# Patient Record
Sex: Female | Born: 1955 | Race: White | Hispanic: No | Marital: Married | Smoking: Never smoker
Health system: Southern US, Community
[De-identification: ages and names within clinical notes are randomized; demographics above are authoritative.]

## PROBLEM LIST (undated history)

## (undated) DIAGNOSIS — K5900 Constipation, unspecified: Secondary | ICD-10-CM

## (undated) DIAGNOSIS — K219 Gastro-esophageal reflux disease without esophagitis: Secondary | ICD-10-CM

## (undated) DIAGNOSIS — G43909 Migraine, unspecified, not intractable, without status migrainosus: Secondary | ICD-10-CM

## (undated) DIAGNOSIS — F419 Anxiety disorder, unspecified: Secondary | ICD-10-CM

## (undated) DIAGNOSIS — I10 Essential (primary) hypertension: Secondary | ICD-10-CM

## (undated) DIAGNOSIS — E785 Hyperlipidemia, unspecified: Secondary | ICD-10-CM

## (undated) DIAGNOSIS — C801 Malignant (primary) neoplasm, unspecified: Secondary | ICD-10-CM

## (undated) DIAGNOSIS — Z9109 Other allergy status, other than to drugs and biological substances: Secondary | ICD-10-CM

## (undated) HISTORY — DX: Migraine, unspecified, not intractable, without status migrainosus: G43.909

## (undated) HISTORY — DX: Other allergy status, other than to drugs and biological substances: Z91.09

## (undated) HISTORY — DX: Anxiety disorder, unspecified: F41.9

## (undated) HISTORY — DX: Constipation, unspecified: K59.00

## (undated) HISTORY — PX: TUBAL LIGATION: SHX77

## (undated) HISTORY — DX: Essential (primary) hypertension: I10

## (undated) HISTORY — PX: DILATION AND CURETTAGE OF UTERUS: SHX78

## (undated) HISTORY — DX: Hyperlipidemia, unspecified: E78.5

## (undated) HISTORY — DX: Gastro-esophageal reflux disease without esophagitis: K21.9

---

## 1977-01-16 HISTORY — PX: TONSILLECTOMY: SUR1361

## 1995-01-17 HISTORY — PX: CHOLECYSTECTOMY: SHX55

## 1997-01-16 HISTORY — PX: VAGINAL HYSTERECTOMY: SUR661

## 2004-10-11 ENCOUNTER — Ambulatory Visit: Payer: Self-pay | Admitting: Internal Medicine

## 2005-10-13 ENCOUNTER — Ambulatory Visit: Payer: Self-pay | Admitting: Internal Medicine

## 2006-10-17 ENCOUNTER — Ambulatory Visit: Payer: Self-pay | Admitting: Internal Medicine

## 2007-12-19 ENCOUNTER — Ambulatory Visit: Payer: Self-pay | Admitting: Internal Medicine

## 2009-01-05 ENCOUNTER — Ambulatory Visit: Payer: Self-pay | Admitting: Internal Medicine

## 2009-04-30 ENCOUNTER — Ambulatory Visit: Payer: Self-pay | Admitting: Unknown Physician Specialty

## 2010-01-11 ENCOUNTER — Ambulatory Visit: Payer: Self-pay | Admitting: Family Medicine

## 2011-01-17 HISTORY — PX: SKIN CANCER EXCISION: SHX779

## 2011-03-03 ENCOUNTER — Ambulatory Visit: Payer: Self-pay | Admitting: Internal Medicine

## 2012-03-04 ENCOUNTER — Ambulatory Visit: Payer: Self-pay | Admitting: Internal Medicine

## 2013-03-18 ENCOUNTER — Ambulatory Visit: Payer: Self-pay | Admitting: Family Medicine

## 2014-03-20 ENCOUNTER — Ambulatory Visit: Payer: Self-pay | Admitting: Family Medicine

## 2014-03-26 ENCOUNTER — Ambulatory Visit: Payer: Self-pay | Admitting: Family Medicine

## 2014-04-27 ENCOUNTER — Encounter: Payer: Self-pay | Admitting: *Deleted

## 2014-05-11 ENCOUNTER — Emergency Department
Admit: 2014-05-11 | Disposition: A | Discharge status: Home / Self Care | Payer: Self-pay | Admitting: Emergency Medicine

## 2014-05-11 LAB — CBC WITH DIFFERENTIAL/PLATELET
BASOS PCT: 0.3 %
Basophil #: 0.1 10*3/uL (ref 0.0–0.1)
EOS PCT: 0.3 %
Eosinophil #: 0.1 10*3/uL (ref 0.0–0.7)
HCT: 39.3 % (ref 35.0–47.0)
HGB: 12.9 g/dL (ref 12.0–16.0)
LYMPHS PCT: 2.9 %
Lymphocyte #: 0.5 10*3/uL — ABNORMAL LOW (ref 1.0–3.6)
MCH: 29 pg (ref 26.0–34.0)
MCHC: 32.8 g/dL (ref 32.0–36.0)
MCV: 88 fL (ref 80–100)
Monocyte #: 0.7 x10 3/mm (ref 0.2–0.9)
Monocyte %: 4.1 %
NEUTROS ABS: 16.6 10*3/uL — AB (ref 1.4–6.5)
Neutrophil %: 92.4 %
Platelet: 201 10*3/uL (ref 150–440)
RBC: 4.44 10*6/uL (ref 3.80–5.20)
RDW: 14 % (ref 11.5–14.5)
WBC: 18 10*3/uL — AB (ref 3.6–11.0)

## 2014-05-11 LAB — BASIC METABOLIC PANEL
ANION GAP: 7 (ref 7–16)
BUN: 15 mg/dL
CHLORIDE: 109 mmol/L
CO2: 26 mmol/L
Calcium, Total: 9.3 mg/dL
Creatinine: 0.89 mg/dL
Glucose: 128 mg/dL — ABNORMAL HIGH
POTASSIUM: 3.2 mmol/L — AB
SODIUM: 142 mmol/L

## 2014-05-11 LAB — URINALYSIS, COMPLETE
Bacteria: NONE SEEN
Bilirubin,UR: NEGATIVE
Glucose,UR: NEGATIVE mg/dL (ref 0–75)
KETONE: NEGATIVE
LEUKOCYTE ESTERASE: NEGATIVE
Nitrite: NEGATIVE
PH: 6 (ref 4.5–8.0)
PROTEIN: NEGATIVE
Specific Gravity: 1.01 (ref 1.003–1.030)
Squamous Epithelial: NONE SEEN

## 2014-05-11 LAB — TROPONIN I
Troponin-I: <0.03 ng/mL
Troponin-I: <0.03 ng/mL

## 2014-10-07 ENCOUNTER — Other Ambulatory Visit: Payer: Self-pay | Admitting: Family Medicine

## 2014-10-07 DIAGNOSIS — R921 Mammographic calcification found on diagnostic imaging of breast: Secondary | ICD-10-CM

## 2014-10-16 ENCOUNTER — Ambulatory Visit
Admission: RE | Admit: 2014-10-16 | Discharge: 2014-10-16 | Disposition: A | Discharge status: Home / Self Care | Payer: Commercial Indemnity | Source: Ambulatory Visit | Attending: Family Medicine | Admitting: Family Medicine

## 2014-10-16 DIAGNOSIS — R921 Mammographic calcification found on diagnostic imaging of breast: Secondary | ICD-10-CM | POA: Diagnosis not present

## 2014-10-16 HISTORY — DX: Malignant (primary) neoplasm, unspecified: C80.1

## 2014-10-19 ENCOUNTER — Other Ambulatory Visit: Payer: Self-pay | Admitting: Family Medicine

## 2014-10-19 DIAGNOSIS — R922 Inconclusive mammogram: Secondary | ICD-10-CM

## 2015-03-17 ENCOUNTER — Other Ambulatory Visit: Payer: Self-pay | Admitting: Pediatrics

## 2015-04-16 ENCOUNTER — Ambulatory Visit: Payer: Commercial Indemnity

## 2015-04-16 ENCOUNTER — Other Ambulatory Visit: Payer: Commercial Indemnity

## 2015-04-16 ENCOUNTER — Ambulatory Visit
Admission: RE | Admit: 2015-04-16 | Discharge: 2015-04-16 | Disposition: A | Discharge status: Home / Self Care | Payer: Commercial Indemnity | Source: Ambulatory Visit | Attending: Family Medicine | Admitting: Family Medicine

## 2015-04-16 DIAGNOSIS — R922 Inconclusive mammogram: Secondary | ICD-10-CM | POA: Diagnosis present

## 2016-01-16 IMAGING — MG MM ADDITIONAL VIEWS AT NO CHARGE
2 series · 2 of 2 positions shown · non-contrast
Comparison: 03/20/2014, 03/18/2013, additional prior studies dating
back to 10/11/2004

CLINICAL DATA: 58-year-old female, callback from screening
mammogram for right breast calcifications

EXAM:
DIGITAL DIAGNOSTIC RIGHT MAMMOGRAM

[R ML]
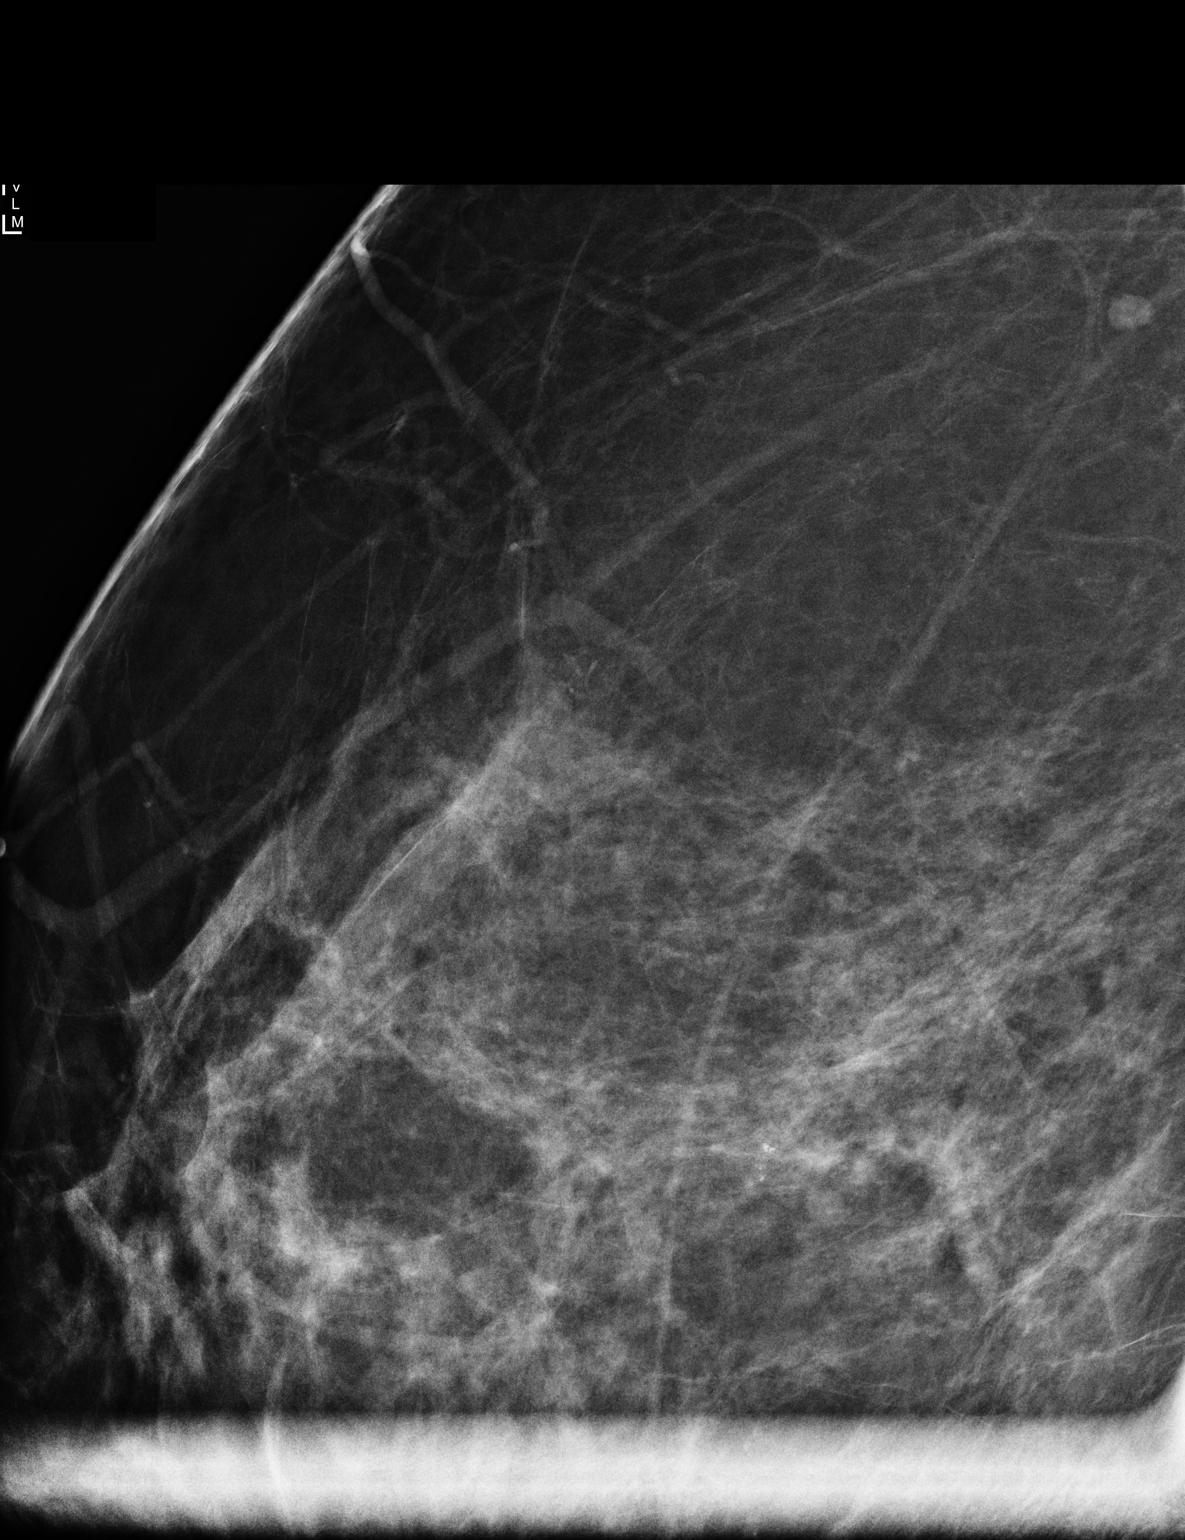

[R CC]
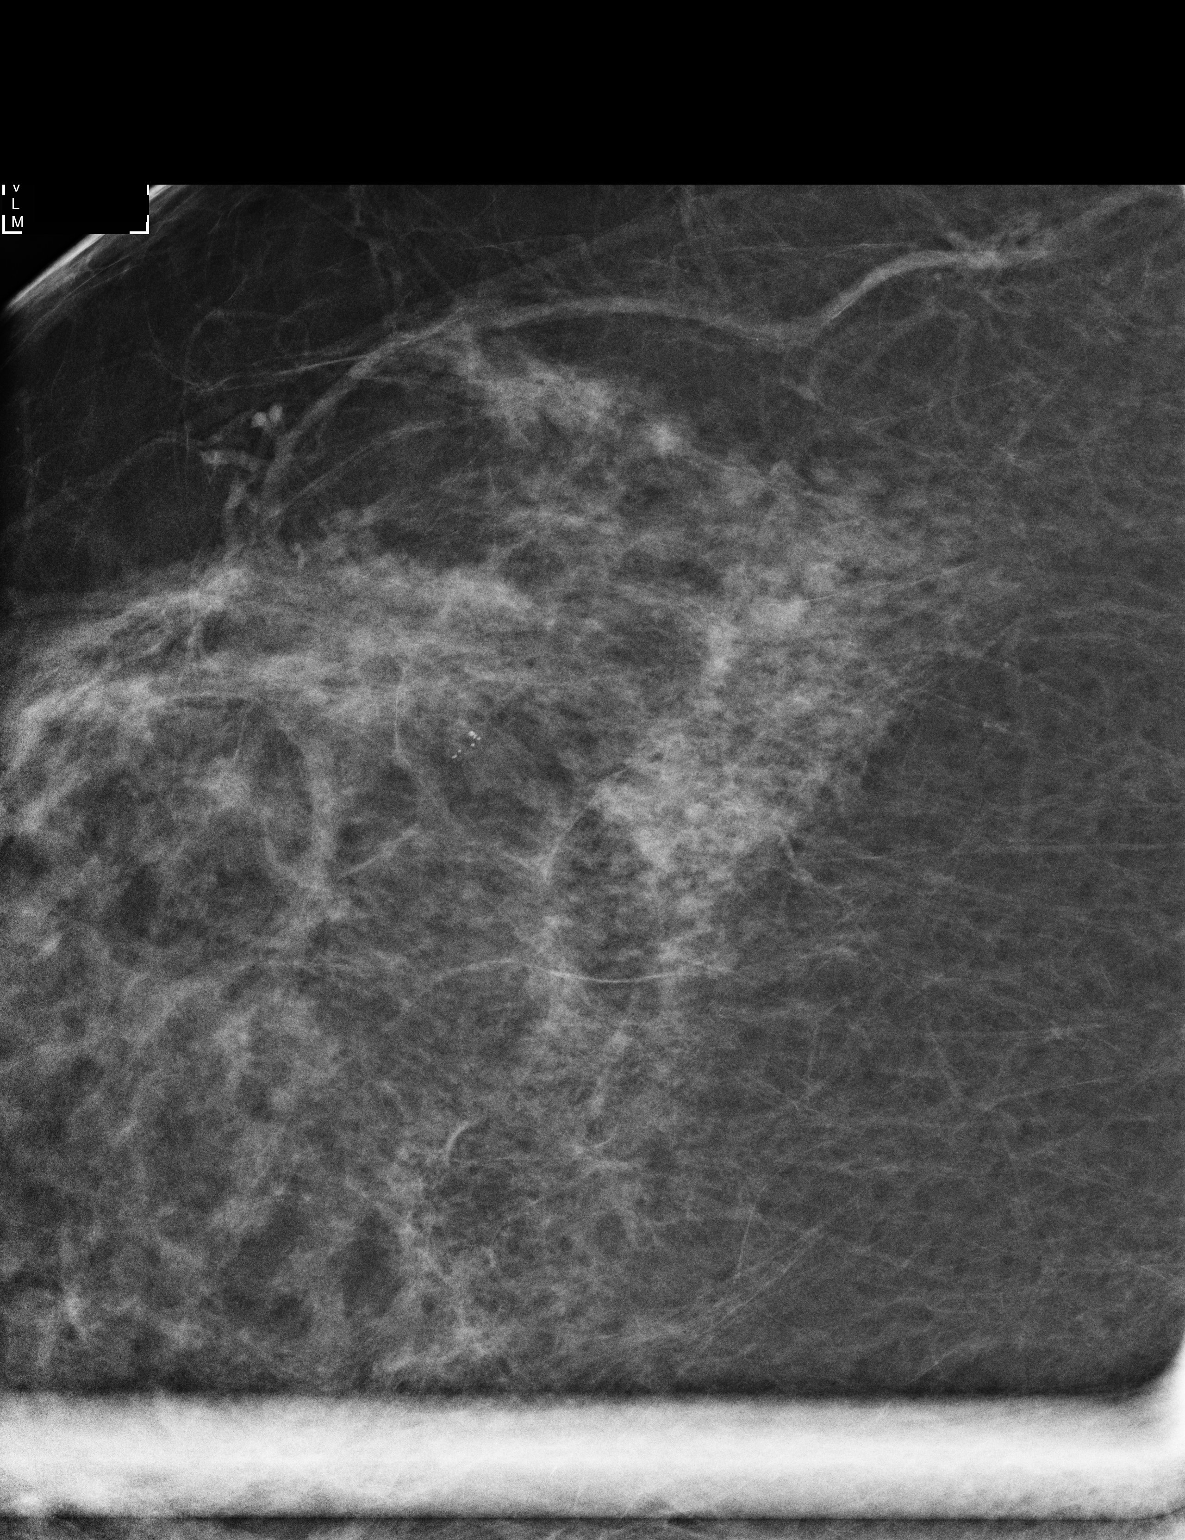

[2 of 2 positions shown; findings below may reference images not displayed]

ACR Breast Density Category b: There are scattered areas of
fibroglandular density.
FINDINGS: On additional views, there is an approximately 4 mm group of
predominantly round calcifications within the upper, outer right
breast, middle depth. These calcifications are felt likely benign.
IMPRESSION: Probably benign right breast calcifications.

RECOMMENDATION:
Right diagnostic mammogram in 6 months. Options including short-term
follow-up versus stereotactic guided right breast biopsy were
discussed with the patient and her husband. The patient wishes to
pursue follow-up at this time.

I have discussed the findings and recommendations with the patient.
Results were also provided in writing at the conclusion of the
visit. If applicable, a reminder letter will be sent to the patient
regarding the next appointment.

BI-RADS CATEGORY  3: Probably benign.

## 2016-03-30 ENCOUNTER — Other Ambulatory Visit: Payer: Self-pay | Admitting: Family Medicine

## 2016-03-30 DIAGNOSIS — R921 Mammographic calcification found on diagnostic imaging of breast: Secondary | ICD-10-CM

## 2016-05-05 ENCOUNTER — Ambulatory Visit
Admission: RE | Admit: 2016-05-05 | Discharge: 2016-05-05 | Disposition: A | Discharge status: Home / Self Care | Payer: Commercial Indemnity | Source: Ambulatory Visit | Attending: Family Medicine | Admitting: Family Medicine

## 2016-05-05 DIAGNOSIS — R921 Mammographic calcification found on diagnostic imaging of breast: Secondary | ICD-10-CM

## 2016-06-07 ENCOUNTER — Encounter: Payer: Self-pay | Admitting: Obstetrics and Gynecology

## 2016-06-07 ENCOUNTER — Ambulatory Visit (INDEPENDENT_AMBULATORY_CARE_PROVIDER_SITE_OTHER): Payer: Managed Care, Other (non HMO) | Admitting: Obstetrics and Gynecology

## 2016-06-07 VITALS — BP 149/91 | HR 88 | Ht 64.0 in | Wt 195.0 lb

## 2016-06-07 DIAGNOSIS — N941 Unspecified dyspareunia: Secondary | ICD-10-CM | POA: Insufficient documentation

## 2016-06-07 DIAGNOSIS — Z90710 Acquired absence of both cervix and uterus: Secondary | ICD-10-CM | POA: Diagnosis not present

## 2016-06-07 DIAGNOSIS — R6882 Decreased libido: Secondary | ICD-10-CM

## 2016-06-07 DIAGNOSIS — E669 Obesity, unspecified: Secondary | ICD-10-CM | POA: Insufficient documentation

## 2016-06-07 DIAGNOSIS — Z780 Asymptomatic menopausal state: Secondary | ICD-10-CM | POA: Diagnosis not present

## 2016-06-07 DIAGNOSIS — N951 Menopausal and female climacteric states: Secondary | ICD-10-CM | POA: Diagnosis not present

## 2016-06-07 DIAGNOSIS — N952 Postmenopausal atrophic vaginitis: Secondary | ICD-10-CM | POA: Diagnosis not present

## 2016-06-07 MED ORDER — ESTRADIOL 2 MG VA RING: 2.0000 mg | VAGINAL_RING | VAGINAL | 3 refills | Status: DC

## 2016-06-07 NOTE — Progress Notes (Signed)
GYN ENCOUNTER NOTE  Subjective:       Carrie Esparza is a 61 y.o. [phone] female is here for gynecologic evaluation of the following issues:  1. Hot flashes 2. Vaginal dryness 3. Dyspareunia 4. Obesity 5. Decreased libido  Status post TVH with anterior colporrhaphy and pubovaginal sling in 1999 Remote history of short-term estrogen replacement therapy in the early 2000's. Currently with major complaints including vasomotor symptoms, vaginal dryness, and associated dyspareunia which has negatively affected her sex drive and relations with her spouse. Bladder function is normal without any significant urinary incontinence. Bowel function is normal with frequency every other day so long as she uses Metamucil.   Gynecologic History No LMP recorded. Patient has had a hysterectomy. Contraception: status post hysterectomy Last Pap: No history of abnormal Paps Last mammogram: 05/05/2016 BI-RADS 2  Obstetric History OB History  Gravida Para Term Preterm AB Living  3 2 2   1 2   SAB TAB Ectopic Multiple Live Births  1       2    # Outcome Date GA Lbr Len/2nd Weight Sex Delivery Anes PTL Lv  3 Term 1992   8 lb 9.6 oz (3.901 kg) M Vag-Spont   LIV  2 SAB 1991          1 Term 1987   8 lb 12.8 oz (3.992 kg) M Vag-Spont   LIV      Past Medical History:  Diagnosis Date  . Acid reflux   . Anxiety   . Cancer ([place])    melanoma  . Constipation   . Environmental allergies   . Hyperlipemia   . Hypertension   . Migraine     Past Surgical History:  Procedure Laterality Date  . CHOLECYSTECTOMY  1997  . DILATION AND CURETTAGE OF UTERUS    . SKIN CANCER EXCISION  2013  . TONSILLECTOMY  1979  . TUBAL LIGATION    . TVH with anterior colporrhaphy and pubovaginal sling   1999   bvd    No current outpatient prescriptions on file prior to visit.   No current facility-administered medications on file prior to visit.     No Known Allergies  Social History   Social History  .  Marital status: Married    Spouse name: N/A  . Number of children: N/A  . Years of education: N/A   Occupational History  . Not on file.   Social History Main Topics  . Smoking status: Never Smoker  . Smokeless tobacco: Never Used  . Alcohol use Yes     Comment: occas  . Drug use: No  . Sexual activity: Yes    Birth control/ protection: Surgical   Other Topics Concern  . Not on file   Social History Narrative  . No narrative on file    Family History  Problem Relation Age of Onset  . Breast cancer Cousin 39  . Breast cancer Paternal Aunt 35  . Diabetes Mother   . Hyperlipidemia Mother   . Hypertension Mother   . Colon cancer Neg Hx   . Ovarian cancer Neg Hx     The following portions of the patient's history were reviewed and updated as appropriate: allergies, current medications, past family history, past medical history, past social history, past surgical history and problem list.  Review of Systems Review of Systems - comprehensive review of systems is negative except for that noted in the history of present illness  Objective:   BP (!) 149/91  Pulse 88   Ht 5\' 4"  (1.626 m)   Wt 195 lb (88.5 kg)   BMI 33.47 kg/m  CONSTITUTIONAL: Well-developed, well-nourished female in no acute distress.  HENT:  Normocephalic, atraumatic.  NECK: Normal range of motion, supple, no masses.  Normal thyroid.  SKIN: Skin is warm and dry. No rash noted. Not diaphoretic. No erythema. No pallor. [place]: Alert and oriented to person, place, and time. PSYCHIATRIC: Normal mood and affect. Normal behavior. Normal judgment and thought content. CARDIOVASCULAR:Not Examined RESPIRATORY: Not Examined BREASTS: Not Examined ABDOMEN: Soft, non distended; Non tender.  No Organomegaly. PELVIC:  External Genitalia: Normal  BUS: Normal  Vagina:Moderate atrophic changes; first-degree cystocele and: Mild rectocele  Cervix: Surgically absent  Uterus: Surgically absent  Adnexa: Normal;  nonpalpable and nontender  RV: Normal external exam; normal sphincter tone; no rectal masses  Bladder: Nontender MUSCULOSKELETAL: Normal range of motion. No tenderness.  No cyanosis, clubbing, or edema.     Assessment:   1. Menopause  2. Vaginal atrophy  3. Dyspareunia, female  4. Status post vaginal hysterectomy  5. Decreased libido  6. Obesity (BMI 30-39.9)  7. Vasomotor symptoms due to menopause     Plan:   1. Begin a trial of Estring 2 mg every 3 months for vaginal 2. Lubricants recommended 3. Left cell changes are discussed including healthy eating with scheduled exercise in order to achieve weight loss 4. Return in 3 months for follow-up 5. Discussed multifactorial etiology to decreased libido in women as compared to man  A total of 30 minutes were spent face-to-face with the patient during the encounter with greater than 50% dealing with counseling and coordination of care.  Brayton Mars, MD  Note: This dictation was prepared with Dragon dictation along with smaller phrase technology. Any transcriptional errors that result from this process are unintentional.

## 2016-06-07 NOTE — Patient Instructions (Signed)
1. Begin using the Estring 2 mg intravaginal every 3 months 2. Vaginal lubricants include:  Astroglide  JO H2O lubricant  Coconut oil  Virgin olive oil 3. Return in 3 months for follow-up

## 2016-09-06 ENCOUNTER — Ambulatory Visit (INDEPENDENT_AMBULATORY_CARE_PROVIDER_SITE_OTHER): Payer: Managed Care, Other (non HMO) | Admitting: Obstetrics and Gynecology

## 2016-09-06 ENCOUNTER — Encounter: Payer: Self-pay | Admitting: Obstetrics and Gynecology

## 2016-09-06 VITALS — BP 152/84 | HR 94 | Ht 64.0 in | Wt 195.4 lb

## 2016-09-06 DIAGNOSIS — R6882 Decreased libido: Secondary | ICD-10-CM

## 2016-09-06 DIAGNOSIS — Z90710 Acquired absence of both cervix and uterus: Secondary | ICD-10-CM

## 2016-09-06 DIAGNOSIS — N941 Unspecified dyspareunia: Secondary | ICD-10-CM

## 2016-09-06 DIAGNOSIS — N951 Menopausal and female climacteric states: Secondary | ICD-10-CM

## 2016-09-06 DIAGNOSIS — Z780 Asymptomatic menopausal state: Secondary | ICD-10-CM | POA: Diagnosis not present

## 2016-09-06 DIAGNOSIS — N952 Postmenopausal atrophic vaginitis: Secondary | ICD-10-CM

## 2016-09-06 NOTE — Patient Instructions (Signed)
1.  Continue using the Estring every 3 months. 2.  Return in 9 months for annual exam

## 2016-09-06 NOTE — Progress Notes (Signed)
Chief complaint: 1.  Menopausal symptoms. 2.  Vaginal atrophy. 3.  Dyspareunia. 4.  Decreased libido.  Carrie Esparza presents today for 3 month follow-up after starting the Estring therapy for estrogen replacement therapy. She has noted a marked reduction in vasomotor symptoms, although they do still occur on occasion. She has noted less vaginal dryness and has not had to use vaginal lubricants since using the Estring. She has had less discomfort with intercourse. She has noted an improvement in her libido. She is not experiencing any side effects from the Estring  Past medical history, past surgical history, problem list.  OBJECTIVE: BP (!) 152/84   Pulse 94   Ht 5\' 4"  (1.626 m)   Wt 195 lb 6.4 oz (88.6 kg)   BMI 33.54 kg/m  Pleasant, well-appearing female in no acute distress. ABDOMEN: Soft, non distended; Non tender.  No Organomegaly. PELVIC:             External Genitalia: Normal             BUS: Normal             Vagina:Minimal atrophic changes; first-degree cystocele and: Mild rectocele ; Estring is noted within the vagina             Cervix: Surgically absent             Uterus: Surgically absent             Adnexa: Normal; nonpalpable and nontender             RV: Normal external exam             Bladder: Nontender  ASSESSMENT: 1.  Vasomotor symptoms, improved. 2.  Vaginal atrophy, improved. 3.  Dyspareunia, improved. 4.  Decreased libido, improved.  PLAN: 1.  Continue using the Estring 2 mg intravaginally every   2.Return in 9 months for annual exam or as needed.  A total of 15 minutes were spent face-to-face with the patient during this encounter and over half of that time dealt with counseling and coordination of care.  Brayton Mars, MD  Note: This dictation was prepared with Dragon dictation along with smaller phrase technology. Any transcriptional errors that result from this process are unintentional.

## 2016-09-07 ENCOUNTER — Encounter: Payer: Managed Care, Other (non HMO) | Admitting: Obstetrics and Gynecology

## 2017-04-20 ENCOUNTER — Other Ambulatory Visit: Payer: Self-pay | Admitting: Family Medicine

## 2017-04-20 DIAGNOSIS — Z1231 Encounter for screening mammogram for malignant neoplasm of breast: Secondary | ICD-10-CM

## 2017-05-11 ENCOUNTER — Ambulatory Visit
Admission: RE | Admit: 2017-05-11 | Discharge: 2017-05-11 | Disposition: A | Discharge status: Home / Self Care | Payer: Managed Care, Other (non HMO) | Source: Ambulatory Visit | Attending: Family Medicine | Admitting: Family Medicine

## 2017-05-11 DIAGNOSIS — Z1231 Encounter for screening mammogram for malignant neoplasm of breast: Secondary | ICD-10-CM | POA: Diagnosis present

## 2017-06-13 ENCOUNTER — Encounter: Payer: Managed Care, Other (non HMO) | Admitting: Obstetrics and Gynecology

## 2017-06-25 NOTE — Progress Notes (Deleted)
GYN ENCOUNTER NOTE  Subjective:       Carrie Esparza is a 62 y.o. [phone] female is here for gynecologic evaluation of the following issues:   1. Hot flashes 2. Vaginal dryness 3. Dyspareunia 4. Obesity 5. Decreased libido  Status post TVH with anterior colporrhaphy and pubovaginal sling in 1999 Remote history of short-term estrogen replacement therapy in the early 2000's. Currently with major complaints including vasomotor symptoms, vaginal dryness, and associated dyspareunia which has negatively affected her sex drive and relations with her spouse. Bladder function is normal without any significant urinary incontinence. Bowel function is normal with frequency every other day so long as she uses Metamucil.   Gynecologic History No LMP recorded. Patient has had a hysterectomy. Contraception: status post hysterectomy Last Pap: No history of abnormal Paps Last mammogram: 05/11/2017  BI-RADS 1  Obstetric History OB History  Gravida Para Term Preterm AB Living  3 2 2   1 2   SAB TAB Ectopic Multiple Live Births  1       2    # Outcome Date GA Lbr Len/2nd Weight Sex Delivery Anes PTL Lv  3 Term 1992   8 lb 9.6 oz (3.901 kg) M Vag-Spont   LIV  2 SAB 1991          1 Term 1987   8 lb 12.8 oz (3.992 kg) M Vag-Spont   LIV    Past Medical History:  Diagnosis Date  . Acid reflux   . Anxiety   . Cancer ([place])    melanoma  . Constipation   . Environmental allergies   . Hyperlipemia   . Hypertension   . Migraine     Past Surgical History:  Procedure Laterality Date  . CHOLECYSTECTOMY  1997  . DILATION AND CURETTAGE OF UTERUS    . SKIN CANCER EXCISION  2013  . TONSILLECTOMY  1979  . TUBAL LIGATION    . TVH with anterior colporrhaphy and pubovaginal sling   1999   bvd    Current Outpatient Medications on File Prior to Visit  Medication Sig Dispense Refill  . Calcium-Magnesium-Vitamin D (CALCIUM 1200+D3 PO)     . citalopram (CELEXA) 10 MG tablet Take by mouth.    .  Coenzyme Q10-Vitamin E 100-100 MG-UNIT CAPS Take by mouth.    . docusate sodium (COLACE) 50 MG capsule     . estradiol (ESTRING) 2 MG vaginal ring Place 2 mg vaginally every 3 (three) months. follow package directions 1 each 3  . fluticasone (FLONASE) 50 MCG/ACT nasal spray Place into the nose.    Marland Kitchen GLUCOSAMINE SULFATE PO Take by mouth.    . loratadine (CLARITIN) 10 MG tablet Take by mouth.    . losartan-hydrochlorothiazide (HYZAAR) 100-25 MG tablet Take by mouth.    Marland Kitchen omeprazole (PRILOSEC) 20 MG capsule TAKE ONE CAPSULE BY MOUTH ONE TIME DAILY     . simvastatin (ZOCOR) 20 MG tablet TAKE ONE TABLET BY MOUTH EVERY NIGHT    . SUMAtriptan (IMITREX) 100 MG tablet Take by mouth.     No current facility-administered medications on file prior to visit.     No Known Allergies  Social History   Socioeconomic History  . Marital status: Married    Spouse name: Not on file  . Number of children: Not on file  . Years of education: Not on file  . Highest education level: Not on file  Occupational History  . Not on file  Social Needs  . Financial  resource strain: Not on file  . Food insecurity:    Worry: Not on file    Inability: Not on file  . Transportation needs:    Medical: Not on file    Non-medical: Not on file  Tobacco Use  . Smoking status: Never Smoker  . Smokeless tobacco: Never Used  Substance and Sexual Activity  . Alcohol use: Yes    Comment: occas  . Drug use: No  . Sexual activity: Yes    Birth control/protection: Surgical  Lifestyle  . Physical activity:    Days per week: Not on file    Minutes per session: Not on file  . Stress: Not on file  Relationships  . Social connections:    Talks on phone: Not on file    Gets together: Not on file    Attends religious service: Not on file    Active member of club or organization: Not on file    Attends meetings of clubs or organizations: Not on file    Relationship status: Not on file  . Intimate partner violence:     Fear of current or ex partner: Not on file    Emotionally abused: Not on file    Physically abused: Not on file    Forced sexual activity: Not on file  Other Topics Concern  . Not on file  Social History Narrative  . Not on file    Family History  Problem Relation Age of Onset  . Breast cancer Cousin 35  . Breast cancer Paternal Aunt 108  . Diabetes Mother   . Hyperlipidemia Mother   . Hypertension Mother   . Colon cancer Neg Hx   . Ovarian cancer Neg Hx     The following portions of the patient's history were reviewed and updated as appropriate: allergies, current medications, past family history, past medical history, past social history, past surgical history and problem list.  Review of Systems Review of Systems - comprehensive review of systems is negative except for that noted in the history of present illness  Objective:   There were no vitals taken for this visit. CONSTITUTIONAL: Well-developed, well-nourished female in no acute distress.  HENT:  Normocephalic, atraumatic.  NECK: Normal range of motion, supple, no masses.  Normal thyroid.  SKIN: Skin is warm and dry. No rash noted. Not diaphoretic. No erythema. No pallor. [place]: Alert and oriented to person, place, and time. PSYCHIATRIC: Normal mood and affect. Normal behavior. Normal judgment and thought content. CARDIOVASCULAR:Not Examined RESPIRATORY: Not Examined BREASTS: Not Examined ABDOMEN: Soft, non distended; Non tender.  No Organomegaly. PELVIC:  External Genitalia: Normal  BUS: Normal  Vagina:Moderate atrophic changes; first-degree cystocele and: Mild rectocele  Cervix: Surgically absent  Uterus: Surgically absent  Adnexa: Normal; nonpalpable and nontender  RV: Normal external exam; normal sphincter tone; no rectal masses  Bladder: Nontender MUSCULOSKELETAL: Normal range of motion. No tenderness.  No cyanosis, clubbing, or edema.     Assessment:   1. Menopause  2. Vaginal atrophy  3.  Dyspareunia, female  4. Status post vaginal hysterectomy  5. Decreased libido  6. Obesity (BMI 30-39.9)  7. Vasomotor symptoms due to menopause     Plan:   1. Begin a trial of Estring 2 mg every 3 months for vaginal 2. Lubricants recommended     A total of 30 minutes were spent face-to-face with the patient during the encounter with greater than 50% dealing with counseling and coordination of care.  Joyice Faster, CMA  Note:  This dictation was prepared with Dragon dictation along with smaller phrase technology. Any transcriptional errors that result from this process are unintentional.

## 2017-07-05 ENCOUNTER — Encounter: Payer: Managed Care, Other (non HMO) | Admitting: Obstetrics and Gynecology

## 2017-08-15 ENCOUNTER — Other Ambulatory Visit: Payer: Self-pay | Admitting: Obstetrics and Gynecology

## 2017-09-05 ENCOUNTER — Encounter: Payer: Self-pay | Admitting: Obstetrics and Gynecology

## 2017-09-05 ENCOUNTER — Ambulatory Visit (INDEPENDENT_AMBULATORY_CARE_PROVIDER_SITE_OTHER): Payer: Managed Care, Other (non HMO) | Admitting: Obstetrics and Gynecology

## 2017-09-05 VITALS — BP 135/81 | HR 101 | Ht 64.0 in | Wt 199.0 lb

## 2017-09-05 DIAGNOSIS — N951 Menopausal and female climacteric states: Secondary | ICD-10-CM

## 2017-09-05 DIAGNOSIS — Z90710 Acquired absence of both cervix and uterus: Secondary | ICD-10-CM | POA: Diagnosis not present

## 2017-09-05 DIAGNOSIS — N941 Unspecified dyspareunia: Secondary | ICD-10-CM | POA: Diagnosis not present

## 2017-09-05 DIAGNOSIS — E669 Obesity, unspecified: Secondary | ICD-10-CM

## 2017-09-05 DIAGNOSIS — Z1211 Encounter for screening for malignant neoplasm of colon: Secondary | ICD-10-CM

## 2017-09-05 DIAGNOSIS — N952 Postmenopausal atrophic vaginitis: Secondary | ICD-10-CM

## 2017-09-05 DIAGNOSIS — Z780 Asymptomatic menopausal state: Secondary | ICD-10-CM

## 2017-09-05 MED ORDER — ESTRADIOL 1 MG PO TABS: 1.0000 mg | ORAL_TABLET | Freq: Every day | ORAL | 1 refills | Status: DC

## 2017-09-05 NOTE — Progress Notes (Signed)
ANNUAL PREVENTATIVE CARE GYN  ENCOUNTER NOTE  Subjective:       Carrie Esparza is a 62 y.o. [phone] female here for a routine annual gynecologic exam.  Current complaints: 1.  Wt gain; patient has been more sedentary the past 6 to 8 weeks due to vacation and other life issues.  She is preparing to return to her regular exercise schedule.  She has gained approximately 10 pounds in the past 2 months. 2.  Vasomotor symptoms persist despite use of Estring; vaginal dryness is not an issue.  She is willing to go on a trial of oral estradiol therapy. 3.  Patient is having difficulties with breast enlargement; breast currently triple D; she is interested in possibly having breast reduction surgery.   Gynecologic History No LMP recorded. Patient has had a hysterectomy. Contraception: TVH anterior colporrhaphy Last FMB:[id number]. Results were: normal Last mammogram: 04/2017 birad 1 Results were: normal  Obstetric History OB History  Gravida Para Term Preterm AB Living  3 2 2   1 2   SAB TAB Ectopic Multiple Live Births  1       2    # Outcome Date GA Lbr Len/2nd Weight Sex Delivery Anes PTL Lv  3 Term 1992   8 lb 9.6 oz (3.901 kg) M Vag-Spont   LIV  2 SAB 1991          1 Term 1987   8 lb 12.8 oz (3.992 kg) M Vag-Spont   LIV    Past Medical History:  Diagnosis Date  . Acid reflux   . Anxiety   . Cancer ([place])    melanoma  . Constipation   . Environmental allergies   . Hyperlipemia   . Hypertension   . Migraine     Past Surgical History:  Procedure Laterality Date  . CHOLECYSTECTOMY  1997  . DILATION AND CURETTAGE OF UTERUS    . SKIN CANCER EXCISION  2013  . TONSILLECTOMY  1979  . TUBAL LIGATION    . VAGINAL HYSTERECTOMY  1999   bvd    Current Outpatient Medications on File Prior to Visit  Medication Sig Dispense Refill  . Calcium-Magnesium-Vitamin D (CALCIUM 1200+D3 PO)     . citalopram (CELEXA) 10 MG tablet Take by mouth.    . Coenzyme Q10-Vitamin E 100-100 MG-UNIT CAPS  Take by mouth.    . docusate sodium (COLACE) 50 MG capsule     . ESTRING 2 MG vaginal ring PLACE 2 MG VAGINALLY EVERY 3 (THREE) MONTHS. FOLLOW PACKAGE DIRECTIONS 1 each 0  . fluticasone (FLONASE) 50 MCG/ACT nasal spray Place into the nose.    Marland Kitchen GLUCOSAMINE SULFATE PO Take by mouth.    . loratadine (CLARITIN) 10 MG tablet Take by mouth.    . losartan-hydrochlorothiazide (HYZAAR) 100-25 MG tablet Take by mouth.    . montelukast (SINGULAIR) 10 MG tablet Take 10 mg by mouth at bedtime.    Marland Kitchen omeprazole (PRILOSEC) 20 MG capsule TAKE ONE CAPSULE BY MOUTH ONE TIME DAILY     . simvastatin (ZOCOR) 20 MG tablet TAKE ONE TABLET BY MOUTH EVERY NIGHT    . SUMAtriptan (IMITREX) 100 MG tablet Take by mouth.     No current facility-administered medications on file prior to visit.     No Known Allergies  Social History   Socioeconomic History  . Marital status: Married    Spouse name: Not on file  . Number of children: Not on file  . Years of education: Not on  file  . Highest education level: Not on file  Occupational History  . Not on file  Social Needs  . Financial resource strain: Not on file  . Food insecurity:    Worry: Not on file    Inability: Not on file  . Transportation needs:    Medical: Not on file    Non-medical: Not on file  Tobacco Use  . Smoking status: Never Smoker  . Smokeless tobacco: Never Used  Substance and Sexual Activity  . Alcohol use: Yes    Comment: occas  . Drug use: No  . Sexual activity: Yes    Birth control/protection: Surgical  Lifestyle  . Physical activity:    Days per week: Not on file    Minutes per session: Not on file  . Stress: Not on file  Relationships  . Social connections:    Talks on phone: Not on file    Gets together: Not on file    Attends religious service: Not on file    Active member of club or organization: Not on file    Attends meetings of clubs or organizations: Not on file    Relationship status: Not on file  . Intimate  partner violence:    Fear of current or ex partner: Not on file    Emotionally abused: Not on file    Physically abused: Not on file    Forced sexual activity: Not on file  Other Topics Concern  . Not on file  Social History Narrative  . Not on file    Family History  Problem Relation Age of Onset  . Breast cancer Cousin 98  . Breast cancer Paternal Aunt 54  . Diabetes Mother   . Hyperlipidemia Mother   . Hypertension Mother   . Colon cancer Neg Hx   . Ovarian cancer Neg Hx     The following portions of the patient's history were reviewed and updated as appropriate: allergies, current medications, past family history, past medical history, past social history, past surgical history and problem list.  Review of Systems Review of Systems  Constitutional:       Vasomotor symptoms persist. Weight gain  HENT: Negative.   Eyes: Negative.   Respiratory: Negative.   Cardiovascular: Negative.   Gastrointestinal: Positive for constipation.  Genitourinary: Negative.   Musculoskeletal: Negative.   Skin: Negative.   Neurological: Negative.   Endo/Heme/Allergies: Negative.   Psychiatric/Behavioral: Negative.    Objective:   BP 135/81   Pulse (!) 101   Ht 5\' 4"  (1.626 m)   Wt 199 lb (90.3 kg)   BMI 34.16 kg/m  CONSTITUTIONAL: Well-developed, well-nourished female in no acute distress.  PSYCHIATRIC: Normal mood and affect. Normal behavior. Normal judgment and thought content. [place]: Alert and oriented to person, place, and time. Normal muscle tone coordination. No cranial nerve deficit noted. HENT:  Normocephalic, atraumatic, External right and left ear normal.  EYES: Conjunctivae and EOM are normal. No scleral icterus.  NECK: Normal range of motion, supple, no masses.  Normal thyroid.  SKIN: Skin is warm and dry. No rash noted. Not diaphoretic. No erythema. No pallor. CARDIOVASCULAR: Normal heart rate noted, regular rhythm, no murmur. RESPIRATORY: Clear to auscultation  bilaterally. Effort and breath sounds normal, no problems with respiration noted. BREASTS asymmetric with right greater than left, pendulous. No masses, skin changes, nipple drainage, or lymphadenopathy.  There is prominent right axillary tail breast tissue noted. ABDOMEN: Soft, no distention noted.  No tenderness, rebound or guarding.  BLADDER:  Normal PELVIC:  External Genitalia: Normal  BUS: Normal  Vagina: Normal estrogen effect; mild rectocele; good apical vaginal vault support; Estring is noted in the vaginal vault  Cervix: Surgically absent  Uterus: Surgically absent  Adnexa: Normal; nonpalpable and nontender  RV: External Exam NormaI, No Rectal Masses and Normal Sphincter tone  MUSCULOSKELETAL: Normal range of motion. No tenderness.  No cyanosis, clubbing, or edema.  2+ distal pulses. LYMPHATIC: No Axillary, Supraclavicular, or Inguinal Adenopathy.    Assessment:   Annual gynecologic examination 62 y.o. Contraception: status post hysterectomy bm- 34 Problem List Items Addressed This Visit    Menopause - Primary   Vaginal atrophy, asymptomatic      Status post vaginal hysterectomy   Obesity (BMI 30-39.9)   Vasomotor symptoms due to menopause, not controlled with Estring    Rectocele, mild, minimally symptomatic  Plan:  Pap: Not needed Mammogram: utd Stool Guaiac Testing:  Ordered Labs: thru pcp Routine preventative health maintenance measures emphasized: Exercise/Diet/Weight control, Tobacco Warnings and Alcohol/Substance use risks Trial of estradiol 1 mg daily.  Discontinue Estring after the most recent Estring is finished. Continue with Colace for stool softener and consider restarting Metamucil. Return to [place], [place]  Brayton Mars, MD  Note: This dictation was prepared with Dragon dictation along with smaller phrase technology. Any transcriptional errors that result from this process are unintentional.

## 2017-09-05 NOTE — Patient Instructions (Signed)
1.  Pap smear is not done.  No further Pap smears are needed. 2.  Mammogram is up-to-date. 3.  Stool guaiac cards are given for colon cancer screening 4.  Labs are to be obtained through primary care for screening purposes 5.  Continue with calcium and vitamin D supplementation-600 mg calcium twice a day and 400 international units of vitamin D twice a day. 6.  Restart healthy eating with exercise and control weight loss; weight loss goal is recommended to be 1 pound per month 7.  Patient is to consider referral to breast surgeon for breast reduction surgery. 8.  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 9.  Return in 1 year for physical.   Health Maintenance for Postmenopausal Women Menopause is a normal process in which your reproductive ability comes to an end. This process happens gradually over a span of months to years, usually between the ages of 68 and 69. Menopause is complete when you have missed 12 consecutive menstrual periods. It is important to talk with your health care provider about some of the most common conditions that affect postmenopausal women, such as heart disease, cancer, and bone loss (osteoporosis). Adopting a healthy lifestyle and getting preventive care can help to promote your health and wellness. Those actions can also lower your chances of developing some of these common conditions. What should I know about menopause? During menopause, you may experience a number of symptoms, such as:  Moderate-to-severe hot flashes.  Night sweats.  Decrease in sex drive.  Mood swings.  Headaches.  Tiredness.  Irritability.  Memory problems.  Insomnia.  Choosing to treat or not to treat menopausal changes is an individual decision that you make with your health care provider. What should I know about hormone replacement therapy and supplements? Hormone therapy products are effective  for treating symptoms that are associated with menopause, such as hot flashes and night sweats. Hormone replacement carries certain risks, especially as you become older. If you are thinking about using estrogen or estrogen with progestin treatments, discuss the benefits and risks with your health care provider. What should I know about heart disease and stroke? Heart disease, heart attack, and stroke become more likely as you age. This may be due, in part, to the hormonal changes that your body experiences during menopause. These can affect how your body processes dietary fats, triglycerides, and cholesterol. Heart attack and stroke are both medical emergencies. There are many things that you can do to help prevent heart disease and stroke:  Have your blood pressure checked at least every 1-2 years. High blood pressure causes heart disease and increases the risk of stroke.  If you are 81-70 years old, ask your health care provider if you should take aspirin to prevent a heart attack or a stroke.  Do not use any tobacco products, including cigarettes, chewing tobacco, or electronic cigarettes. If you need help quitting, ask your health care provider.  It is important to eat a healthy diet and maintain a healthy weight. ? Be sure to include plenty of vegetables, fruits, low-fat dairy products, and lean protein. ? Avoid eating foods that are high in solid fats, added sugars, or salt (sodium).  Get regular exercise. This is one of the most important things that you can do for your health. ? Try to exercise for at least 150 minutes each week. The type of exercise that you do should increase  your heart rate and make you sweat. This is known as moderate-intensity exercise. ? Try to do strengthening exercises at least twice each week. Do these in addition to the moderate-intensity exercise.  Know your numbers.Ask your health care provider to check your cholesterol and your blood glucose. Continue to  have your blood tested as directed by your health care provider.  What should I know about cancer screening? There are several types of cancer. Take the following steps to reduce your risk and to catch any cancer development as early as possible. Breast Cancer  Practice breast self-awareness. ? This means understanding how your breasts normally appear and feel. ? It also means doing regular breast self-exams. Let your health care provider know about any changes, no matter how small.  If you are 59 or older, have a clinician do a breast exam (clinical breast exam or CBE) every year. Depending on your age, family history, and medical history, it may be recommended that you also have a yearly breast X-ray (mammogram).  If you have a family history of breast cancer, talk with your health care provider about genetic screening.  If you are at high risk for breast cancer, talk with your health care provider about having an MRI and a mammogram every year.  Breast cancer (BRCA) gene test is recommended for women who have family members with BRCA-related cancers. Results of the assessment will determine the need for genetic counseling and BRCA1 and for BRCA2 testing. BRCA-related cancers include these types: ? Breast. This occurs in males or females. ? Ovarian. ? Tubal. This may also be called fallopian tube cancer. ? Cancer of the abdominal or pelvic lining (peritoneal cancer). ? Prostate. ? Pancreatic.  Cervical, Uterine, and Ovarian Cancer Your health care provider may recommend that you be screened regularly for cancer of the pelvic organs. These include your ovaries, uterus, and vagina. This screening involves a pelvic exam, which includes checking for microscopic changes to the surface of your cervix (Pap test).  For women ages 21-65, health care providers may recommend a pelvic exam and a Pap test every three years. For women ages 59-65, they may recommend the Pap test and pelvic exam,  combined with testing for human papilloma virus (HPV), every five years. Some types of HPV increase your risk of cervical cancer. Testing for HPV may also be done on women of any age who have unclear Pap test results.  Other health care providers may not recommend any screening for nonpregnant women who are considered low risk for pelvic cancer and have no symptoms. Ask your health care provider if a screening pelvic exam is right for you.  If you have had past treatment for cervical cancer or a condition that could lead to cancer, you need Pap tests and screening for cancer for at least 20 years after your treatment. If Pap tests have been discontinued for you, your risk factors (such as having a new sexual partner) need to be reassessed to determine if you should start having screenings again. Some women have medical problems that increase the chance of getting cervical cancer. In these cases, your health care provider may recommend that you have screening and Pap tests more often.  If you have a family history of uterine cancer or ovarian cancer, talk with your health care provider about genetic screening.  If you have vaginal bleeding after reaching menopause, tell your health care provider.  There are currently no reliable tests available to screen for ovarian cancer.  Lung Cancer Lung cancer screening is recommended for adults 50-42 years old who are at high risk for lung cancer because of a history of smoking. A yearly low-dose CT scan of the lungs is recommended if you:  Currently smoke.  Have a history of at least 30 pack-years of smoking and you currently smoke or have quit within the past 15 years. A pack-year is smoking an average of one pack of cigarettes per day for one year.  Yearly screening should:  Continue until it has been 15 years since you quit.  Stop if you develop a health problem that would prevent you from having lung cancer treatment.  Colorectal Cancer  This  type of cancer can be detected and can often be prevented.  Routine colorectal cancer screening usually begins at age 48 and continues through age 17.  If you have risk factors for colon cancer, your health care provider may recommend that you be screened at an earlier age.  If you have a family history of colorectal cancer, talk with your health care provider about genetic screening.  Your health care provider may also recommend using home test kits to check for hidden blood in your stool.  A small camera at the end of a tube can be used to examine your colon directly (sigmoidoscopy or colonoscopy). This is done to check for the earliest forms of colorectal cancer.  Direct examination of the colon should be repeated every 5-10 years until age 64. However, if early forms of precancerous polyps or small growths are found or if you have a family history or genetic risk for colorectal cancer, you may need to be screened more often.  Skin Cancer  Check your skin from head to toe regularly.  Monitor any moles. Be sure to tell your health care provider: ? About any new moles or changes in moles, especially if there is a change in a mole's shape or color. ? If you have a mole that is larger than the size of a pencil eraser.  If any of your family members has a history of skin cancer, especially at a young age, talk with your health care provider about genetic screening.  Always use sunscreen. Apply sunscreen liberally and repeatedly throughout the day.  Whenever you are outside, protect yourself by wearing long sleeves, pants, a wide-brimmed hat, and sunglasses.  What should I know about osteoporosis? Osteoporosis is a condition in which bone destruction happens more quickly than new bone creation. After menopause, you may be at an increased risk for osteoporosis. To help prevent osteoporosis or the bone fractures that can happen because of osteoporosis, the following is recommended:  If you  are 23-27 years old, get at least 1,000 mg of calcium and at least 600 mg of vitamin D per day.  If you are older than age 88 but younger than age 73, get at least 1,200 mg of calcium and at least 600 mg of vitamin D per day.  If you are older than age 45, get at least 1,200 mg of calcium and at least 800 mg of vitamin D per day.  Smoking and excessive alcohol intake increase the risk of osteoporosis. Eat foods that are rich in calcium and vitamin D, and do weight-bearing exercises several times each week as directed by your health care provider. What should I know about how menopause affects my mental health? Depression may occur at any age, but it is more common as you become older. Common symptoms of  include:  Low or sad mood.  Changes in sleep patterns.  Changes in appetite or eating patterns.  Feeling an overall lack of motivation or enjoyment of activities that you previously enjoyed.  Frequent crying spells.  Talk with your health care provider if you think that you are experiencing depression. What should I know about immunizations? It is important that you get and maintain your immunizations. These include:  Tetanus, diphtheria, and pertussis (Tdap) booster vaccine.  Influenza every year before the flu season begins.  Pneumonia vaccine.  Shingles vaccine.  Your health care provider may also recommend other immunizations. This information is not intended to replace advice given to you by your health care provider. Make sure you discuss any questions you have with your health care provider. Document Released: 02/24/2005 Document Revised: 07/23/2015 Document Reviewed: 10/06/2014 [employer] Interactive Patient Education  2018 [employer]  

## 2017-09-27 ENCOUNTER — Other Ambulatory Visit: Payer: Self-pay | Admitting: Obstetrics and Gynecology

## 2018-04-22 ENCOUNTER — Other Ambulatory Visit: Payer: Self-pay | Admitting: Surgical

## 2018-04-22 MED ORDER — ESTRADIOL 1 MG PO TABS: 1.0000 mg | ORAL_TABLET | Freq: Every day | ORAL | 1 refills | Status: AC

## 2018-07-02 ENCOUNTER — Other Ambulatory Visit: Payer: Self-pay | Admitting: Family Medicine

## 2018-07-02 DIAGNOSIS — Z1231 Encounter for screening mammogram for malignant neoplasm of breast: Secondary | ICD-10-CM

## 2018-09-11 ENCOUNTER — Encounter: Payer: Managed Care, Other (non HMO) | Admitting: Obstetrics and Gynecology

## 2018-11-22 ENCOUNTER — Ambulatory Visit
Admission: RE | Admit: 2018-11-22 | Discharge: 2018-11-22 | Disposition: A | Discharge status: Home / Self Care | Payer: Managed Care, Other (non HMO) | Source: Ambulatory Visit | Attending: Family Medicine | Admitting: Family Medicine

## 2018-11-22 DIAGNOSIS — Z1231 Encounter for screening mammogram for malignant neoplasm of breast: Secondary | ICD-10-CM | POA: Insufficient documentation

## 2019-10-22 ENCOUNTER — Other Ambulatory Visit: Payer: Self-pay | Admitting: Family Medicine

## 2019-10-22 DIAGNOSIS — Z1231 Encounter for screening mammogram for malignant neoplasm of breast: Secondary | ICD-10-CM

## 2019-11-24 ENCOUNTER — Ambulatory Visit
Admission: RE | Admit: 2019-11-24 | Discharge: 2019-11-24 | Disposition: A | Discharge status: Home / Self Care | Payer: Managed Care, Other (non HMO) | Source: Ambulatory Visit | Attending: Family Medicine | Admitting: Family Medicine

## 2019-11-24 ENCOUNTER — Other Ambulatory Visit: Payer: Self-pay

## 2019-11-24 DIAGNOSIS — Z1231 Encounter for screening mammogram for malignant neoplasm of breast: Secondary | ICD-10-CM | POA: Diagnosis present

## 2020-11-05 ENCOUNTER — Other Ambulatory Visit: Payer: Self-pay | Admitting: Family Medicine

## 2020-11-05 DIAGNOSIS — Z1231 Encounter for screening mammogram for malignant neoplasm of breast: Secondary | ICD-10-CM

## 2020-12-07 ENCOUNTER — Other Ambulatory Visit: Payer: Self-pay

## 2020-12-07 ENCOUNTER — Ambulatory Visit
Admission: RE | Admit: 2020-12-07 | Discharge: 2020-12-07 | Disposition: A | Discharge status: Home / Self Care | Payer: Managed Care, Other (non HMO) | Source: Ambulatory Visit | Attending: Family Medicine | Admitting: Family Medicine

## 2020-12-07 DIAGNOSIS — Z1231 Encounter for screening mammogram for malignant neoplasm of breast: Secondary | ICD-10-CM | POA: Diagnosis present

## 2022-01-03 ENCOUNTER — Other Ambulatory Visit: Payer: Self-pay | Admitting: Family Medicine

## 2022-01-03 DIAGNOSIS — Z1231 Encounter for screening mammogram for malignant neoplasm of breast: Secondary | ICD-10-CM

## 2022-01-04 ENCOUNTER — Ambulatory Visit
Admission: RE | Admit: 2022-01-04 | Discharge: 2022-01-04 | Disposition: A | Discharge status: Home / Self Care | Payer: Managed Care, Other (non HMO) | Source: Ambulatory Visit | Attending: Family Medicine | Admitting: Family Medicine

## 2022-01-04 DIAGNOSIS — Z1231 Encounter for screening mammogram for malignant neoplasm of breast: Secondary | ICD-10-CM | POA: Diagnosis present

## 2022-09-29 IMAGING — MG MM DIGITAL SCREENING BILAT W/ TOMO AND CAD
6 of 10 series · 6 of 30 positions shown · non-contrast
Comparison: Previous exam(s).

CLINICAL DATA: Screening.

EXAM:
DIGITAL SCREENING BILATERAL MAMMOGRAM WITH TOMOSYNTHESIS AND CAD
TECHNIQUE: Bilateral screening digital craniocaudal and mediolateral oblique
mammograms were obtained. Bilateral screening digital breast
tomosynthesis was performed. The images were evaluated with
computer-aided detection.

[R CC synth-2D]
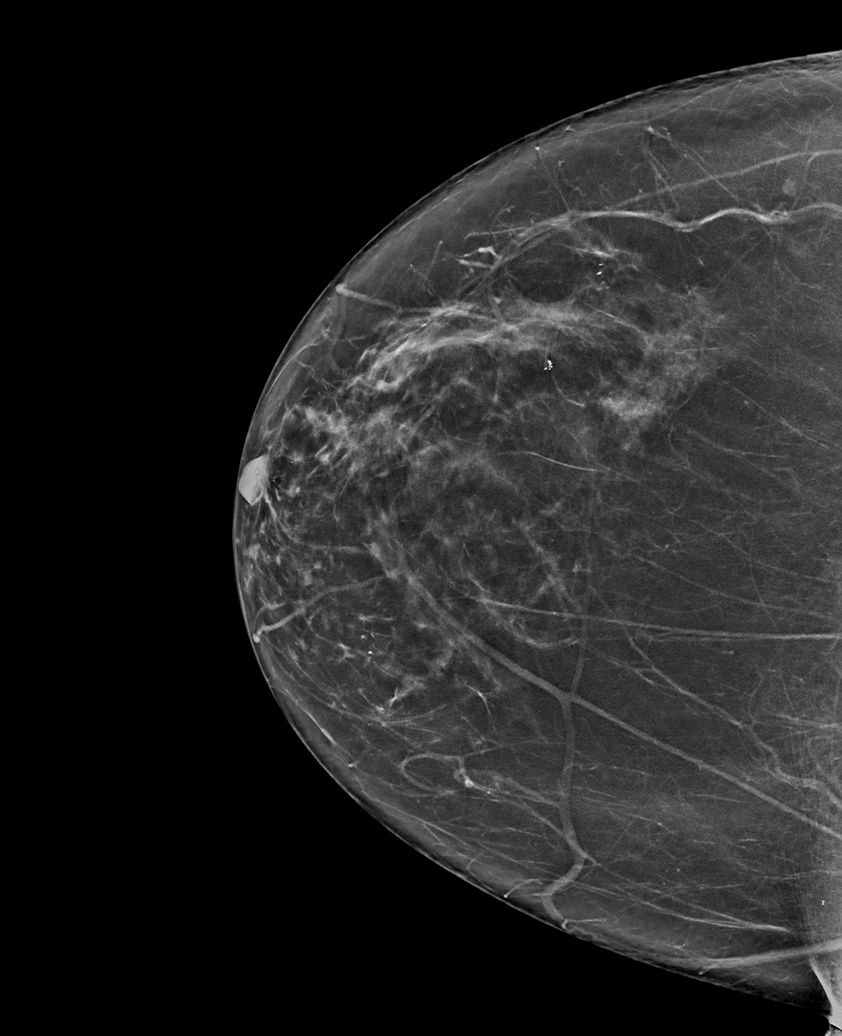

[L MLO synth-2D]
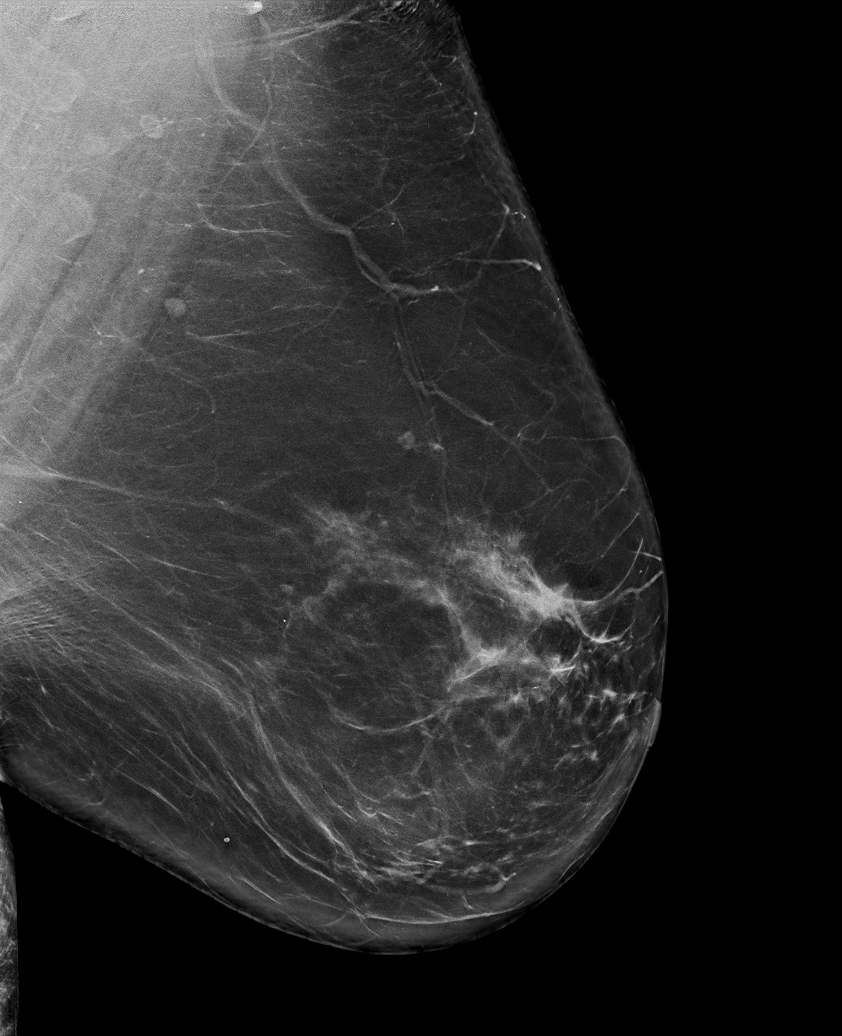

[R MLO synth-2D]
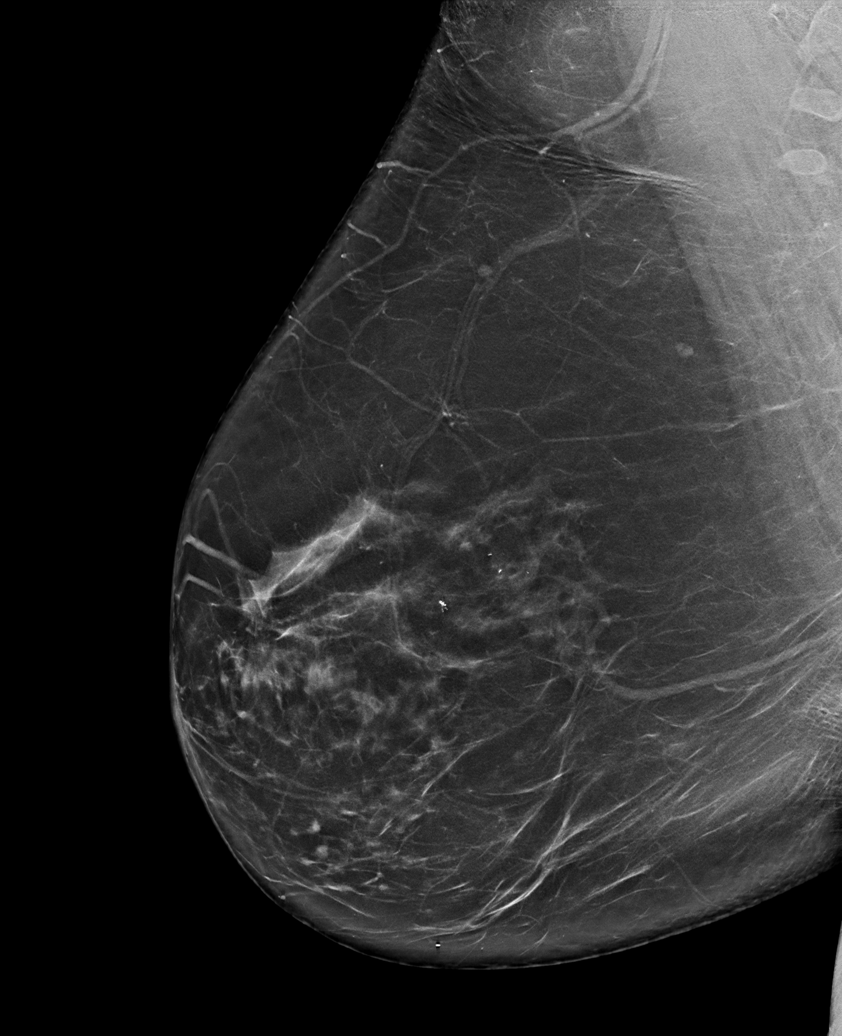

[L CC synth-2D]
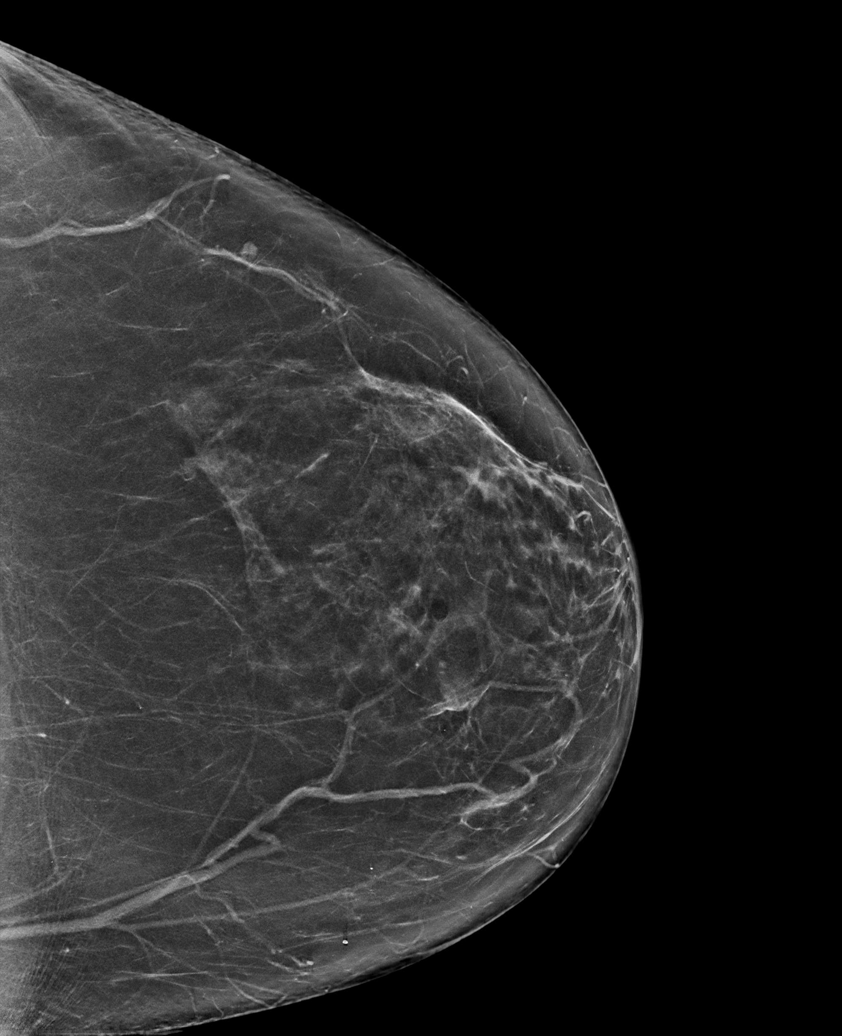

[R CV synth-2D]
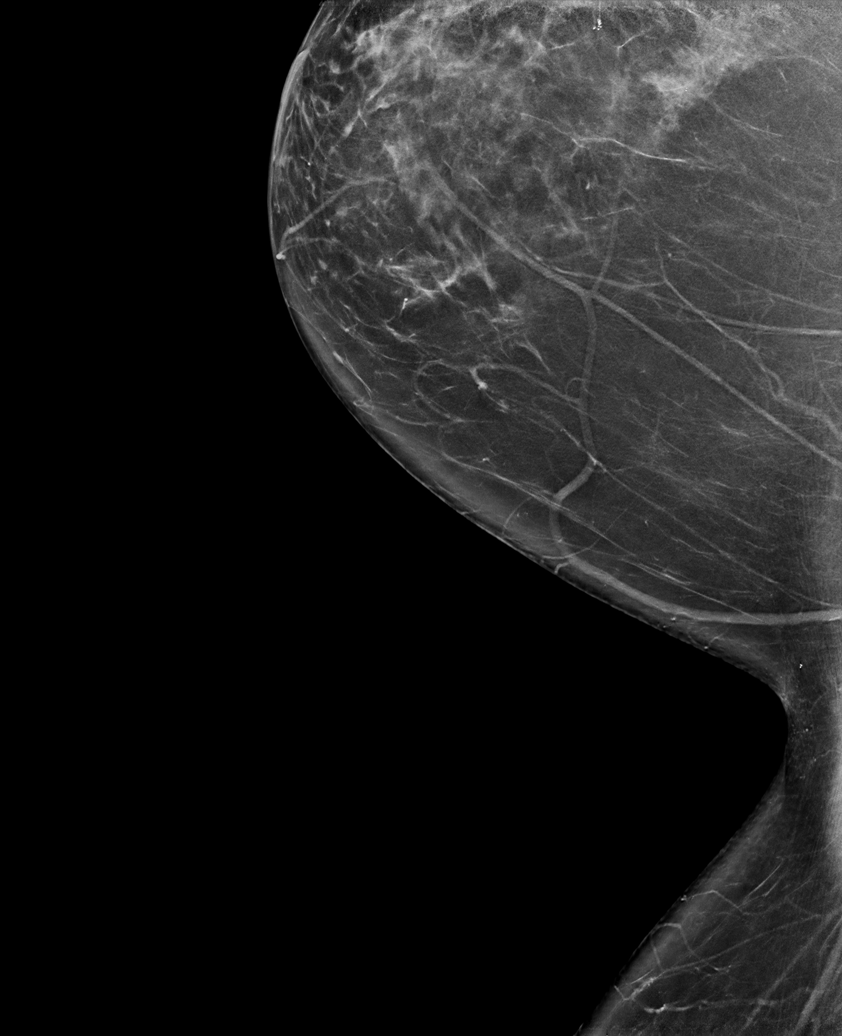

[R CC tomo · tomo slice 38/75.0]
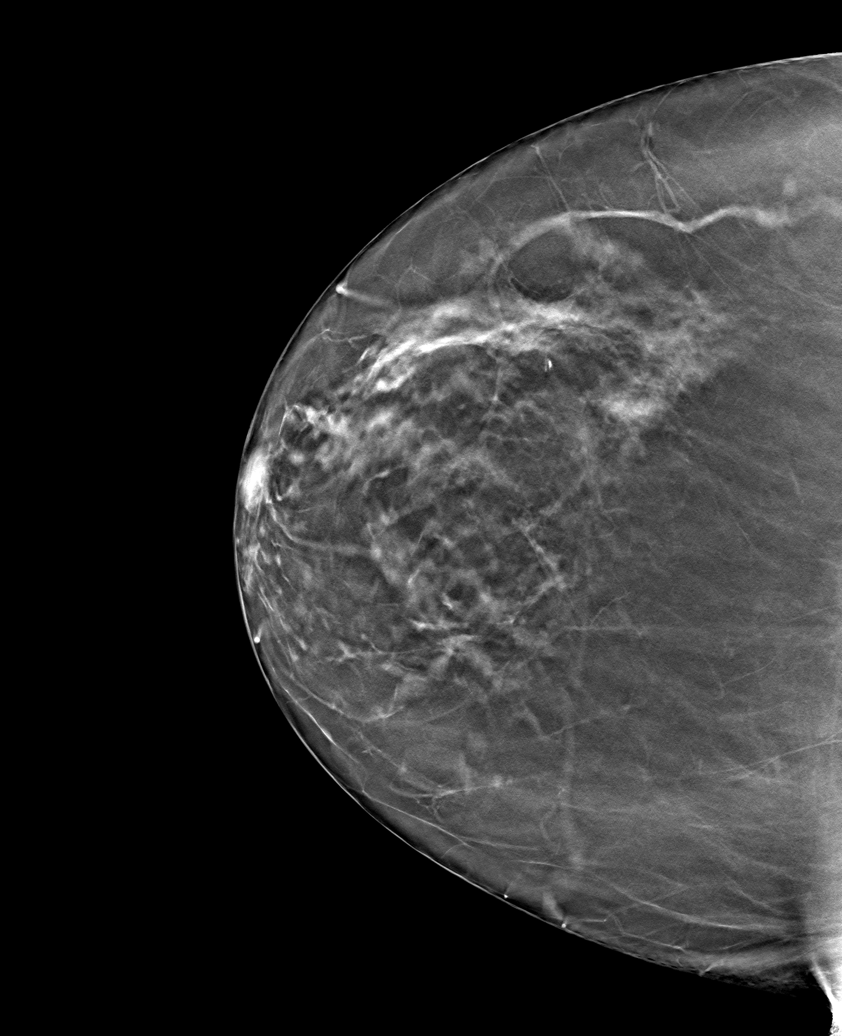

[6 of 30 positions shown; findings below may reference images not displayed]

ACR Breast Density Category b: There are scattered areas of
fibroglandular density.
FINDINGS: There are no findings suspicious for malignancy.
IMPRESSION: No mammographic evidence of malignancy. A result letter of this
screening mammogram will be mailed directly to the patient.

RECOMMENDATION:
Screening mammogram in one year. (Code:[id number]-O-LD2)

BI-RADS CATEGORY  1: Negative.

## 2022-10-23 ENCOUNTER — Ambulatory Visit: Admit: 2022-10-23 | Payer: Managed Care, Other (non HMO) | Admitting: Orthopedic Surgery

## 2022-10-23 SURGERY — ARTHROPLASTY, KNEE, TOTAL, USING IMAGELESS COMPUTER-ASSISTED NAVIGATION
Anesthesia: Choice | Site: Knee | Laterality: Left

## 2022-12-28 ENCOUNTER — Other Ambulatory Visit: Payer: Self-pay | Admitting: Family Medicine

## 2022-12-28 DIAGNOSIS — Z1231 Encounter for screening mammogram for malignant neoplasm of breast: Secondary | ICD-10-CM

## 2023-01-11 ENCOUNTER — Ambulatory Visit
Admission: RE | Admit: 2023-01-11 | Discharge: 2023-01-11 | Disposition: A | Discharge status: Home / Self Care | Payer: Managed Care, Other (non HMO) | Source: Ambulatory Visit | Attending: Family Medicine | Admitting: Family Medicine

## 2023-01-11 DIAGNOSIS — Z1231 Encounter for screening mammogram for malignant neoplasm of breast: Secondary | ICD-10-CM | POA: Insufficient documentation

## 2023-12-11 ENCOUNTER — Other Ambulatory Visit: Payer: Self-pay | Admitting: Family Medicine

## 2023-12-11 DIAGNOSIS — Z1231 Encounter for screening mammogram for malignant neoplasm of breast: Secondary | ICD-10-CM

## 2024-01-14 ENCOUNTER — Ambulatory Visit
Admission: RE | Admit: 2024-01-14 | Discharge: 2024-01-14 | Disposition: A | Discharge status: Home / Self Care | Source: Ambulatory Visit | Attending: Family Medicine | Admitting: Family Medicine

## 2024-01-14 DIAGNOSIS — Z1231 Encounter for screening mammogram for malignant neoplasm of breast: Secondary | ICD-10-CM | POA: Insufficient documentation

## 2024-01-18 ENCOUNTER — Other Ambulatory Visit: Payer: Self-pay | Admitting: Family Medicine

## 2024-01-18 DIAGNOSIS — R928 Other abnormal and inconclusive findings on diagnostic imaging of breast: Secondary | ICD-10-CM

## 2024-01-23 ENCOUNTER — Ambulatory Visit
Admission: RE | Admit: 2024-01-23 | Discharge: 2024-01-23 | Disposition: A | Discharge status: Home / Self Care | Source: Ambulatory Visit | Attending: Family Medicine | Admitting: Family Medicine

## 2024-01-23 DIAGNOSIS — R928 Other abnormal and inconclusive findings on diagnostic imaging of breast: Secondary | ICD-10-CM | POA: Diagnosis present
